# Patient Record
Sex: Male | Born: 1960 | Race: White | Hispanic: No | Marital: Single | Smoking: Current every day smoker
Health system: Southern US, Community
[De-identification: ages and names within clinical notes are randomized; demographics above are authoritative.]

## PROBLEM LIST (undated history)

## (undated) DIAGNOSIS — F1010 Alcohol abuse, uncomplicated: Secondary | ICD-10-CM

## (undated) DIAGNOSIS — F419 Anxiety disorder, unspecified: Secondary | ICD-10-CM

## (undated) DIAGNOSIS — F329 Major depressive disorder, single episode, unspecified: Secondary | ICD-10-CM

## (undated) DIAGNOSIS — F32A Depression, unspecified: Secondary | ICD-10-CM

## (undated) DIAGNOSIS — F1920 Other psychoactive substance dependence, uncomplicated: Secondary | ICD-10-CM

## (undated) HISTORY — PX: NOSE SURGERY: SHX723

## (undated) HISTORY — PX: NO PAST SURGERIES: SHX2092

---

## 2001-11-15 ENCOUNTER — Encounter: Payer: Self-pay | Admitting: Emergency Medicine

## 2001-11-15 ENCOUNTER — Emergency Department (HOSPITAL_COMMUNITY): Admission: EM | Admit: 2001-11-15 | Discharge: 2001-11-15 | Payer: Self-pay | Admitting: Emergency Medicine

## 2011-01-07 ENCOUNTER — Encounter (HOSPITAL_COMMUNITY): Payer: Self-pay | Admitting: *Deleted

## 2011-01-07 ENCOUNTER — Encounter: Payer: Self-pay | Admitting: *Deleted

## 2011-01-07 ENCOUNTER — Inpatient Hospital Stay (HOSPITAL_COMMUNITY)
Admission: AD | Admit: 2011-01-07 | Discharge: 2011-01-11 | DRG: 751 | Disposition: A | Discharge status: Home / Self Care | Payer: BC Managed Care – PPO | Source: Ambulatory Visit | Attending: Psychiatry | Admitting: Psychiatry

## 2011-01-07 ENCOUNTER — Emergency Department (HOSPITAL_COMMUNITY)
Admission: EM | Admit: 2011-01-07 | Discharge: 2011-01-07 | Disposition: A | Discharge status: Other Acute Inpatient Hospital | Payer: BC Managed Care – PPO | Attending: Emergency Medicine | Admitting: Emergency Medicine

## 2011-01-07 DIAGNOSIS — R45851 Suicidal ideations: Secondary | ICD-10-CM

## 2011-01-07 DIAGNOSIS — Z79899 Other long term (current) drug therapy: Secondary | ICD-10-CM

## 2011-01-07 DIAGNOSIS — F329 Major depressive disorder, single episode, unspecified: Secondary | ICD-10-CM

## 2011-01-07 DIAGNOSIS — R259 Unspecified abnormal involuntary movements: Secondary | ICD-10-CM | POA: Insufficient documentation

## 2011-01-07 DIAGNOSIS — F3289 Other specified depressive episodes: Secondary | ICD-10-CM

## 2011-01-07 DIAGNOSIS — F17200 Nicotine dependence, unspecified, uncomplicated: Secondary | ICD-10-CM | POA: Insufficient documentation

## 2011-01-07 DIAGNOSIS — F1410 Cocaine abuse, uncomplicated: Secondary | ICD-10-CM | POA: Insufficient documentation

## 2011-01-07 DIAGNOSIS — F411 Generalized anxiety disorder: Secondary | ICD-10-CM

## 2011-01-07 DIAGNOSIS — F1020 Alcohol dependence, uncomplicated: Secondary | ICD-10-CM | POA: Insufficient documentation

## 2011-01-07 DIAGNOSIS — F1010 Alcohol abuse, uncomplicated: Principal | ICD-10-CM

## 2011-01-07 DIAGNOSIS — F1994 Other psychoactive substance use, unspecified with psychoactive substance-induced mood disorder: Secondary | ICD-10-CM

## 2011-01-07 DIAGNOSIS — F1210 Cannabis abuse, uncomplicated: Secondary | ICD-10-CM | POA: Insufficient documentation

## 2011-01-07 HISTORY — DX: Depression, unspecified: F32.A

## 2011-01-07 HISTORY — DX: Alcohol abuse, uncomplicated: F10.10

## 2011-01-07 HISTORY — DX: Major depressive disorder, single episode, unspecified: F32.9

## 2011-01-07 HISTORY — DX: Anxiety disorder, unspecified: F41.9

## 2011-01-07 LAB — RAPID URINE DRUG SCREEN, HOSP PERFORMED
Cocaine: POSITIVE — AB
Opiates: NOT DETECTED

## 2011-01-07 LAB — CBC
HCT: 43.8 % (ref 39.0–52.0)
Hemoglobin: 15.9 g/dL (ref 13.0–17.0)
MCH: 31.9 pg (ref 26.0–34.0)
MCHC: 36.3 g/dL — ABNORMAL HIGH (ref 30.0–36.0)
MCV: 87.8 fL (ref 78.0–100.0)
Platelets: 151 10*3/uL (ref 150–400)
RBC: 4.99 MIL/uL (ref 4.22–5.81)
RDW: 13.2 % (ref 11.5–15.5)
WBC: 10.2 10*3/uL (ref 4.0–10.5)

## 2011-01-07 LAB — COMPREHENSIVE METABOLIC PANEL
ALT: 52 U/L (ref 0–53)
AST: 61 U/L — ABNORMAL HIGH (ref 0–37)
Albumin: 3.9 g/dL (ref 3.5–5.2)
Alkaline Phosphatase: 95 U/L (ref 39–117)
BUN: 7 mg/dL (ref 6–23)
CO2: 26 mEq/L (ref 19–32)
Calcium: 9.4 mg/dL (ref 8.4–10.5)
Chloride: 96 mEq/L (ref 96–112)
Creatinine, Ser: 0.77 mg/dL (ref 0.50–1.35)
GFR calc Af Amer: >90 mL/min (ref >90)
GFR calc non Af Amer: >90 mL/min (ref >90)
Glucose, Bld: 93 mg/dL (ref 70–99)
Potassium: 3.7 mEq/L (ref 3.5–5.1)
Sodium: 135 mEq/L (ref 135–145)
Total Bilirubin: 0.6 mg/dL (ref 0.3–1.2)
Total Protein: 8.2 g/dL (ref 6.0–8.3)

## 2011-01-07 LAB — ETHANOL: Alcohol, Ethyl (B): <11 mg/dL (ref 0–11)

## 2011-01-07 LAB — ACETAMINOPHEN LEVEL: Acetaminophen (Tylenol), Serum: <15 ug/mL (ref 10–30)

## 2011-01-07 MED ORDER — PNEUMOCOCCAL VAC POLYVALENT 25 MCG/0.5ML IJ INJ
0.5000 mL | INJECTION | INTRAMUSCULAR | Status: AC
  Administered 2011-01-08: 0.5 mL via INTRAMUSCULAR

## 2011-01-07 MED ORDER — CHLORDIAZEPOXIDE HCL 25 MG PO CAPS
25.0000 mg | ORAL_CAPSULE | ORAL | Status: AC
  Administered 2011-01-09 – 2011-01-10 (×2): 25 mg via ORAL
  Filled 2011-01-07 (×2): qty 1

## 2011-01-07 MED ORDER — CHLORDIAZEPOXIDE HCL 25 MG PO CAPS
50.0000 mg | ORAL_CAPSULE | Freq: Once | ORAL | Status: AC
  Administered 2011-01-07: 50 mg via ORAL

## 2011-01-07 MED ORDER — LOPERAMIDE HCL 2 MG PO CAPS: 2.0000 mg | ORAL_CAPSULE | ORAL | Status: AC | PRN

## 2011-01-07 MED ORDER — MAGNESIUM HYDROXIDE 400 MG/5ML PO SUSP: 30.0000 mL | Freq: Every day | ORAL | Status: DC | PRN

## 2011-01-07 MED ORDER — ONDANSETRON 4 MG PO TBDP: 4.0000 mg | ORAL_TABLET | Freq: Four times a day (QID) | ORAL | Status: AC | PRN

## 2011-01-07 MED ORDER — ACETAMINOPHEN 325 MG PO TABS: 650.0000 mg | ORAL_TABLET | Freq: Four times a day (QID) | ORAL | Status: DC | PRN

## 2011-01-07 MED ORDER — CHLORDIAZEPOXIDE HCL 25 MG PO CAPS
25.0000 mg | ORAL_CAPSULE | Freq: Four times a day (QID) | ORAL | Status: AC
  Administered 2011-01-07 – 2011-01-08 (×4): 25 mg via ORAL
  Filled 2011-01-07 (×4): qty 1

## 2011-01-07 MED ORDER — IBUPROFEN 200 MG PO TABS: 600.0000 mg | ORAL_TABLET | Freq: Three times a day (TID) | ORAL | Status: DC | PRN

## 2011-01-07 MED ORDER — HYDROXYZINE PAMOATE 25 MG PO CAPS: 25.0000 mg | ORAL_CAPSULE | Freq: Four times a day (QID) | ORAL | Status: DC | PRN

## 2011-01-07 MED ORDER — ONDANSETRON HCL 4 MG PO TABS: 4.0000 mg | ORAL_TABLET | Freq: Three times a day (TID) | ORAL | Status: DC | PRN

## 2011-01-07 MED ORDER — CHLORDIAZEPOXIDE HCL 25 MG PO CAPS
25.0000 mg | ORAL_CAPSULE | Freq: Four times a day (QID) | ORAL | Status: DC | PRN
Start: 2011-01-07 — End: 2011-01-07
  Filled 2011-01-07: qty 2

## 2011-01-07 MED ORDER — CHLORDIAZEPOXIDE HCL 25 MG PO CAPS
25.0000 mg | ORAL_CAPSULE | Freq: Every day | ORAL | Status: AC
  Administered 2011-01-11: 25 mg via ORAL
  Filled 2011-01-07: qty 1

## 2011-01-07 MED ORDER — CHLORDIAZEPOXIDE HCL 25 MG PO CAPS
25.0000 mg | ORAL_CAPSULE | Freq: Three times a day (TID) | ORAL | Status: AC
  Administered 2011-01-08 – 2011-01-09 (×3): 25 mg via ORAL
  Filled 2011-01-07 (×3): qty 1

## 2011-01-07 MED ORDER — CHLORDIAZEPOXIDE HCL 25 MG PO CAPS
25.0000 mg | ORAL_CAPSULE | Freq: Four times a day (QID) | ORAL | Status: AC | PRN
  Administered 2011-01-10: 25 mg via ORAL

## 2011-01-07 MED ORDER — VITAMIN B-1 100 MG PO TABS
100.0000 mg | ORAL_TABLET | Freq: Every day | ORAL | Status: DC
  Administered 2011-01-08 – 2011-01-11 (×4): 100 mg via ORAL
  Filled 2011-01-07 (×6): qty 1

## 2011-01-07 MED ORDER — THERA M PLUS PO TABS
1.0000 | ORAL_TABLET | Freq: Every day | ORAL | Status: DC
  Administered 2011-01-07: 19:00:00 via ORAL
  Administered 2011-01-08: 1 via ORAL
  Administered 2011-01-09: 09:00:00 via ORAL
  Administered 2011-01-10 – 2011-01-11 (×2): 1 via ORAL
  Filled 2011-01-07 (×6): qty 1

## 2011-01-07 MED ORDER — ALUM & MAG HYDROXIDE-SIMETH 200-200-20 MG/5ML PO SUSP: 30.0000 mL | ORAL | Status: DC | PRN

## 2011-01-07 MED ORDER — INFLUENZA VIRUS VACC SPLIT PF IM SUSP
0.5000 mL | INTRAMUSCULAR | Status: AC
  Administered 2011-01-08: 0.5 mL via INTRAMUSCULAR

## 2011-01-07 MED ORDER — ACETAMINOPHEN 325 MG PO TABS: 650.0000 mg | ORAL_TABLET | ORAL | Status: DC | PRN

## 2011-01-07 MED ORDER — HYDROXYZINE HCL 25 MG PO TABS
25.0000 mg | ORAL_TABLET | Freq: Four times a day (QID) | ORAL | Status: AC | PRN
  Administered 2011-01-07 – 2011-01-09 (×2): 25 mg via ORAL

## 2011-01-07 MED ORDER — ZOLPIDEM TARTRATE 5 MG PO TABS: 5.0000 mg | ORAL_TABLET | Freq: Every evening | ORAL | Status: DC | PRN

## 2011-01-07 MED ORDER — NICOTINE 21 MG/24HR TD PT24: 21.0000 mg | MEDICATED_PATCH | Freq: Every day | TRANSDERMAL | Status: DC | PRN

## 2011-01-07 NOTE — ED Notes (Addendum)
Attempt to call report to [facility], RN not able to take report at this time

## 2011-01-07 NOTE — ED Notes (Signed)
Pt states "here because of ETOH, drank last drink yesterday 2-3 pm, drinking 20-30 beers every day"

## 2011-01-07 NOTE — ED Notes (Signed)
Psych Assessment: pt arrived to room 35, ambulatory, alert and oriented. Reports chief complain is ETOH withdrawal. Pt is voluntary wanting to seek help for the withdrawl. Reports he drinks approximately 20-30 cans of beer per day. Pt last drink was last night, didn't drink today. Report nausea, minor hand tremor noted. Pt has good thought process, logical judgement, and good insight. Respiration even and unlabored. Good circulation, and able to move all extremities

## 2011-01-07 NOTE — Consult Note (Signed)
Patient Identification:  Gerald Rivera Date of Evaluation:  01/07/2011   History of Present Illness: Patient seen and assessment reviewed. 50 year old Caucasian male with history of polysubstance dependence came for alcohol detox and is motivated for inpatient rehabilitation also. Patient is logical and goal-directed not suicidal or homicidal not hallucinating or delusional. Alert awake oriented x3. Patient is still tremulous. Memory immediate recent remote fair insight and judgment intact.   Past Medical History:     Past Medical History  Diagnosis Date  . Alcohol abuse       History reviewed. No pertinent past surgical history.  Allergies: No Known Allergies  Current Medications:  Prior to Admission medications   Medication Sig Start Date End Date Taking? Authorizing Provider  acetaminophen (TYLENOL) 500 MG tablet Take 500 mg by mouth every 6 (six) hours as needed. pain    Yes Historical Provider, MD    Social History:    reports that he has been smoking.  He does not have any smokeless tobacco history on file. He reports that he drinks about 144 ounces of alcohol per week. He reports that he uses illicit drugs (Marijuana).   Family History:    No family history on file.   DIAGNOSIS:   AXIS I  polysubstance dependence   AXIS II  Deffered  AXIS III See medical notes.  AXIS IV  recent death of the mother   AXIS V 40     Recommendations: #1 patient will be started on a Librium detox protocol and will be admitted in the inpatient setting.   Eulogio Ditch, MD

## 2011-01-07 NOTE — ED Notes (Signed)
Pt been accepted to [facility], report given to [facility]. Pt will be transfer to [facility].

## 2011-01-07 NOTE — ED Notes (Signed)
One patient belonging bag locked in activity room. 

## 2011-01-07 NOTE — ED Notes (Signed)
Report given to [facility]  

## 2011-01-07 NOTE — ED Provider Notes (Signed)
History     CSN: [id number] Arrival date & time: 01/07/2011  8:00 AM   First MD Initiated Contact with Patient 01/07/11 [phone]      Chief Complaint  Patient presents with  . Medical Clearance    (Consider location/radiation/quality/duration/timing/severity/associated sxs/prior treatment) Patient is a 50 y.o. male presenting with alcohol problem.  Alcohol Problem This is a chronic problem. The current episode started more than 1 week ago. The problem occurs constantly (He drinks large amounts of beer every day.). Progression since onset: His drinking got worse 3 months ago, when his mother died. Pertinent negatives include no chest pain, no abdominal pain, no headaches and no shortness of breath. The symptoms are aggravated by nothing. The symptoms are relieved by nothing. He has tried nothing (He has never gone through a detox program) for the symptoms.   the patient feels that the alcohol use is preventing him from his job, and taking over his life. He has noticed a tremor when he stops drinking.    Past Medical History  Diagnosis Date  . Alcohol abuse     History reviewed. No pertinent past surgical history.  No family history on file.  History  Substance Use Topics  . Smoking status: Current Everyday Smoker -- 2.0 packs/day  . Smokeless tobacco: Not on file  . Alcohol Use: 144.0 oz/week    240 Cans of beer per week      Review of Systems  Respiratory: Negative for shortness of breath.   Cardiovascular: Negative for chest pain.  Gastrointestinal: Negative for abdominal pain.  Neurological: Negative for headaches.  All other systems reviewed and are negative.    Allergies  Review of patient's allergies indicates no known allergies.  Home Medications   Current Outpatient Rx  Name Route Sig Dispense Refill  . ACETAMINOPHEN 500 MG PO TABS Oral Take 500 mg by mouth every 6 (six) hours as needed. pain       BP 140/102  Pulse 95  Temp(Src) 97.8 F (36.6 C) (Oral)   Resp 16  Wt 180 lb (81.647 kg)  SpO2 100%  Physical Exam  Constitutional: He is oriented to person, place, and time. He appears well-developed and well-nourished.  HENT:  Head: Normocephalic and atraumatic.  Eyes: Conjunctivae and EOM are normal. Pupils are equal, round, and reactive to light.  Neck: Normal range of motion. Neck supple.  Cardiovascular: Normal rate.   Pulmonary/Chest: Effort normal and breath sounds normal.  Abdominal: Soft. Bowel sounds are normal.  Musculoskeletal: Normal range of motion.  Neurological: He is alert and oriented to person, place, and time. No cranial nerve deficit. He exhibits normal muscle tone. Coordination normal.       Mild non-intentional tremor  Skin: Skin is warm and dry.  Psychiatric: He has a normal mood and affect. His behavior is normal. Judgment and thought content normal.    ED Course  Procedures (including critical care time) Holding orders and move to psychiatric unit are initiated. Labs Reviewed  URINE RAPID DRUG SCREEN (HOSP PERFORMED) - Abnormal; Notable for the following:    Cocaine POSITIVE (*)    Tetrahydrocannabinol POSITIVE (*)    All other components within normal limits  CBC  COMPREHENSIVE METABOLIC PANEL  ETHANOL  ACETAMINOPHEN LEVEL   No results found.   1. Alcoholism   2. Cocaine abuse   3. Marijuana abuse       MDM  He has alcoholism, with desire to stop. He is showing early signs of alcohol withdrawal syndrome,  and will need to be monitored closely. Associated polysubstance abuse.        Flint Melter, MD 01/07/11 2046

## 2011-01-07 NOTE — BH Assessment (Addendum)
Assessment Note   Gerald Rivera is an 50 y.o. male.  Patient presented to the [facility] ED requesting detox from ETOH, Crack/Cocaine, and THC. Patient reports he has been binge drinking for 6 days in a row in an attempt to self medicate to deal with mother's death 4 months ago. 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  Patient reports drinking 25-30 12 oz cans of beer daily for the past 6 days and he last drank yesterday. Patient denies drinking beer today.   Patient also reports cocaine use 4 days ago, and THC "a few days" ago. Unable to recall amount used.  Longest period of sobriety for patient is unknown as he has always drank alcohol and he reports it has recently become a problem where he is unable to stop.  Patient reports being motivated because he has responsibilities he is unable to deal with while intoxicated.  Patient denies SI/HI/AVH.  Axis I: 304.80  Polysubstance Dependence,  Axis II: Deferred Axis III:  Past Medical History  Diagnosis Date  . Alcohol abuse    Axis IV: problems with primary support group Axis V: 41-50 serious symptoms  Past Medical History:  Past Medical History  Diagnosis Date  . Alcohol abuse     History reviewed. No pertinent past surgical history.  Family History: No family history on file.  Social History:  reports that he has been smoking.  He does not have any smokeless tobacco history on file. He reports that he drinks about 144 ounces of alcohol per week. He reports that he uses illicit drugs (Marijuana).  Allergies: No Known Allergies  Home Medications:  Medications Prior to Admission  Medication Dose Route Frequency Provider Last Rate Last Dose  . acetaminophen (TYLENOL) tablet 650 mg  650 mg Oral Q4H PRN Flint Melter, MD      . chlordiazePOXIDE (LIBRIUM) capsule 25 mg  25 mg Oral QID PRN Flint Melter, MD      . chlordiazePOXIDE (LIBRIUM) capsule 50 mg  50 mg Oral  Once Flint Melter, MD   50 mg at 01/07/11 1003  . ibuprofen (ADVIL,MOTRIN) tablet 600 mg  600 mg Oral Q8H PRN Flint Melter, MD      . nicotine (NICODERM CQ - dosed in mg/24 hours) patch 21 mg  21 mg Transdermal Daily PRN Flint Melter, MD      . ondansetron [facility]) tablet 4 mg  4 mg Oral Q8H PRN Flint Melter, MD      . zolpidem (AMBIEN) tablet 5 mg  5 mg Oral QHS PRN Flint Melter, MD       No current outpatient prescriptions on file as of 01/07/2011.    OB/GYN Status:  No LMP for male patient.  General Assessment Data Assessment Number: 1  Living Arrangements: Alone Can pt return to current living arrangement?: Yes Admission Status: Voluntary Is patient capable of signing voluntary admission?: Yes Transfer from: Acute Hospital Referral Source: Self/Family/Friend  Risk to self Suicidal Ideation: No Suicidal Intent: No Is patient at risk for suicide?: No Suicidal Plan?: No Access to Means: No What has been your use of drugs/alcohol within the last 12 months?: ETOH, THC, Cocaine/Daily/25-30 beers, varies, varies/Daily binge/Ongoing SA Other Self Harm Risks: na Triggers for Past Attempts: None known Intentional Self Injurious Behavior: None Factors that decrease suicide risk: Absense of psychosis Family Suicide History: No Recent stressful life event(s): Loss (  Comment) (Mother died 4 months ago) Persecutory voices/beliefs?: No Depression: Yes Depression Symptoms: Tearfulness;Isolating;Guilt;Loss of interest in usual pleasures;Feeling worthless/self pity Substance abuse history and/or treatment for substance abuse?: Yes (Unable to recall location, "years ago") Suicide prevention information given to non-admitted patients: Not applicable  Risk to Others Homicidal Ideation: No Thoughts of Harm to Others: No Current Homicidal Intent: No Current Homicidal Plan: No Access to Homicidal Means: No History of harm to others?: No Assessment of Violence: None  Noted Violent Behavior Description:   Does patient have access to weapons?: No Criminal Charges Pending?: No Does patient have a court date: No  Mental Status Report Appear/Hygiene: Disheveled Eye Contact: Fair Motor Activity: Restlessness Speech: Soft;Slow Level of Consciousness: Alert Mood: Guilty;Depressed Affect: Depressed Anxiety Level: None Thought Processes: Coherent;Relevant Judgement: Unimpaired Orientation: Place;Person;Time;Situation Obsessive Compulsive Thoughts/Behaviors: None  Cognitive Functioning Concentration: Normal Memory: Recent Intact;Remote Intact IQ: Average Insight: Fair Impulse Control: Poor Appetite: Good Sleep: Decreased Total Hours of Sleep:  (Not consistent) Vegetative Symptoms: None  Prior Inpatient/Outpatient Therapy Prior Therapy: Outpatient Prior Therapy Dates:  (Unable to recall) Prior Therapy Facilty/Provider(s):  (Unable to recall) Reason for Treatment:  (Rehab)            Values / Beliefs Cultural Requests During Hospitalization: None Spiritual Requests During Hospitalization: None        Additional Information 1:1 In Past 12 Months?: No CIRT Risk: No Elopement Risk: No Does patient have medical clearance?: Yes     Disposition:  Disposition Disposition of Patient: Inpatient treatment program (Pending [facility] Inpt detox)  On Site Evaluation by:   Reviewed with Physician:     Ileene Hutchinson 01/07/2011 11:30 AM   Patient has been accepted by Dr. Rogers Blocker to Dr. Dan Humphreys, bed 500-2. Patient's support paperwork has been completed. EDP notified and is in agreement with disposition. EDP will discharge pt to [facility]. Pt nurse notified as well. ALL appropriate paperwork completed and forwarded to [facility] for review.   Ileene Hutchinson , MSW, LCSWA 01/07/2011 1:03 PM

## 2011-01-07 NOTE — Tx Team (Signed)
Initial Interdisciplinary Treatment Plan  PATIENT STRENGTHS: (choose at least two) Ability for insight Average or above average intelligence Communication skills General fund of knowledge Motivation for treatment/growth Physical Health Work skills  PATIENT STRESSORS: Substance abuse   PROBLEM LIST: Problem List/Patient Goals Date to be addressed Date deferred Reason deferred Estimated date of resolution  Substance abuse 01/07/11     depression 01/07/11                                                DISCHARGE CRITERIA:  Ability to meet basic life and health needs Improved stabilization in mood, thinking, and/or behavior Motivation to continue treatment in a less acute level of care Need for constant or close observation no longer present Withdrawal symptoms are absent or subacute and managed without 24-hour nursing intervention  PRELIMINARY DISCHARGE PLAN: Attend 12-step recovery group Return to previous living arrangement  PATIENT/FAMIILY INVOLVEMENT: This treatment plan has been presented to and reviewed with the patient, Gerald Rivera, and/or family member,  The patient and family have been given the opportunity to ask questions and make suggestions.  Beatrix Shipper 01/07/2011, 5:50 PM

## 2011-01-07 NOTE — Progress Notes (Addendum)
Pt admitted voluntary for substance abuse of alcohol. Pt drinking up to a case of beer a day. He lives alone and lists cousin and uncle as support. He denies si, hi and hallucinations. No medical hx. Pt reports that he has jury duty Dec 13. Pt uses marijuana and lists stressor as recent loss of mother.

## 2011-01-07 NOTE — ED Notes (Signed)
Pt & possessions wanded by [employer], report called Mosetta Putt, Charity fundraiser

## 2011-01-08 DIAGNOSIS — F1910 Other psychoactive substance abuse, uncomplicated: Secondary | ICD-10-CM

## 2011-01-08 MED ORDER — GUAIFENESIN ER 600 MG PO TB12
600.0000 mg | ORAL_TABLET | Freq: Two times a day (BID) | ORAL | Status: DC
  Administered 2011-01-08 – 2011-01-11 (×6): 600 mg via ORAL
  Filled 2011-01-08 (×9): qty 1

## 2011-01-08 MED ORDER — NICOTINE 21 MG/24HR TD PT24
21.0000 mg | MEDICATED_PATCH | Freq: Every day | TRANSDERMAL | Status: DC
  Administered 2011-01-08 – 2011-01-11 (×5): 21 mg via TRANSDERMAL
  Filled 2011-01-08 (×6): qty 1

## 2011-01-08 NOTE — Progress Notes (Signed)
Received pt from [facility], RN at 2300. Since that time, has been resting quietly in bed with eyes closed. Respirations are even and unlabored. No acute pain or discomfort noted. Safety has been maintained with Q15minute observation. Will continue current POC. 

## 2011-01-08 NOTE — Progress Notes (Signed)
Had minimal interaction with patient.  He does report that his day has been fine.  He is complaining of cold symptoms, mainly a nagging cough.  Reminded pt he had meds available for his symptoms if needed.  Encouraged to drink fluids.  Denies SI at this time.   He has no other complaints.  Safety maintained with q15 minute checks.

## 2011-01-08 NOTE — Progress Notes (Signed)
[facility] Group Notes: (Counselor/Nursing/MHT/Case Management/Adjunct)   Type of Therapy:  Group Therapy  Participation Level:  None  Participation Quality:  Attentive  Affect:  Blunted  Cognitive:  Appropriate  Insight:  None  Engagement in Group: Minimal   Engagement in Therapy:  None  Modes of Intervention:  Support and Exploration  Summary of Progress/Problems:  Gerald Rivera appeared attentive but did not verbally engage in group.   Gerald Rivera 01/08/2011  2:25 PM

## 2011-01-08 NOTE — Progress Notes (Signed)
[facility] Group Notes: (Counselor/Nursing/MHT/Case Management/Adjunct)  Type of Therapy: Group Therapy   Participation Level: Minimal  Participation Quality: Attentive   Affect: Blunted   Cognitive: Appropriate   Insight: None   Engagement in Group: Minimal   Engagement in Therapy: None   Modes of Intervention: Support and Exploration   Summary of Progress/Problems: Gerald Rivera appeared to participate in meditation exercise but did not verbally engage in group.   [name]  01/08/2011 3:07 PM

## 2011-01-08 NOTE — Progress Notes (Addendum)
Suicide Risk Assessment  Admission Assessment     Demographic factors:  Assessment Details Time of Assessment: Admission Information Obtained From: Patient Current Mental Status:  Current Mental Status:  (denies si) Loss Factors:  Loss Factors:  (none) Historical Factors:  Historical Factors:  (none) Risk Reduction Factors:  Risk Reduction Factors: Employed;Positive social support  CLINICAL FACTORS:   Depression:   Comorbid alcohol abuse/dependence Alcohol/Substance Abuse/Dependencies Previous Psychiatric Diagnoses and Treatments  COGNITIVE FEATURES THAT CONTRIBUTE TO RISK:  Closed-mindedness    SUICIDE RISK:   Mild:  Suicidal ideation of limited frequency, intensity, duration, and specificity.  There are no identifiable plans, no associated intent, mild dysphoria and related symptoms, good self-control (both objective and subjective assessment), few other risk factors, and identifiable protective factors, including available and accessible social support.  Patient denies suicidal or homicidal ideation, hallucinations, illusions, or delusions. Patient engages with good eye contact, is able to focus adequately in a one to one setting, and has clear goal directed thoughts. Patient speaks with a natural conversational volume, rate, and tone. Anxiety was reported at 1 on a scale of 1 the least and 10 the most. Depression was reported also at 1 on the same scale. Patient is oriented times 4, recent and remote memory intact. Judgement: limited by his alcoholic mind set and denial Insight: quite limited Plan: Detox and encourage him to consider attending AA after he leaves. ELOS: 3 to 5 days.  Gerald Rivera 01/08/2011, 5:02 PM

## 2011-01-08 NOTE — H&P (Signed)
Psychiatric Admission Assessment Adult  Patient Identification:  Gerald Rivera Date of Evaluation:  01/08/2011 Chief Complaint:  Polysubstance Dependence History of Present Illness:: Gerald Rivera is a 50 year old Caucasian male. Admitted from the [facility] ED presenting with long history of alcoholism. Patient reports, this time around, I drank for 7 days straight without eating any foods. I lost my mother 3 months ago, my drinking problems got worse since after Mom's death. 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 My drinking problem is just about ruin my life as I know it. I have had DUIs in the past, revoked licence as well. I have my license to drive now, I don't want to lose it again. I have gone from drinking to using other drugs, cocaine and weed. I have to deal with this problem or the out-come will not be good" Mood Symptoms:  Anhedonia Depression Helplessness Hopelessness Sadness Depression Symptoms:  depressed mood, feelings of worthlessness/guilt and hopelessness (Hypo) Manic Symptoms:   Elevated Mood:  No Irritable Mood:  Yes Grandiosity:  No Distractibility:  No Labiality of Mood:  No Delusions:  No Hallucinations:  No Impulsivity:  No Sexually Inappropriate Behavior:  No Financial Extravagance:  No Flight of Ideas:  No  Anxiety Symptoms: Excessive Worry:  Yes Panic Symptoms:  No Agoraphobia:  No Obsessive Compulsive: No  Symptoms: Patient denies Specific Phobias:  No Social Anxiety:  No  Psychotic Symptoms:  Hallucinations:  None Delusions:  No Paranoia:  No   Ideas of Reference:  No  PTSD Symptoms: Ever had a traumatic exposure:  No Had a traumatic exposure in the last month:  No Hypervigilance:  No Hyperarousal:  None Avoidance:  None  Traumatic Brain Injury: Patient denies.  Reviewed H&P from the ED, agreed with findings.  Past Psychiatric History: Diagnosis:poly-substance abuse and dependency  Hospitalizations:[facility]  Outpatient Care:None    Substance Abuse Care:None reported  Self-Mutilation:Denies  Suicidal Attempts:Denies  Violent Behaviors:Denies   Past Medical History:   Past Medical History  Diagnosis Date  . Alcohol abuse   . Anxiety   . Depression    History of Loss of Consciousness:  No Seizure History:  No Cardiac History:  No Allergies:  No Known Allergies Current Medications:  Current Facility-Administered Medications  Medication Dose Route Frequency Provider Last Rate Last Dose  . acetaminophen (TYLENOL) tablet 650 mg  650 mg Oral Q6H PRN Shamsher Alvie Heidelberg, MD      . alum & mag hydroxide-simeth (MAALOX/MYLANTA) 200-200-20 MG/5ML suspension 30 mL  30 mL Oral Q4H PRN Shamsher Alvie Heidelberg, MD      . chlordiazePOXIDE (LIBRIUM) capsule 25 mg  25 mg Oral Q6H PRN Katheren Puller, MD      . chlordiazePOXIDE (LIBRIUM) capsule 25 mg  25 mg Oral QID Katheren Puller, MD   25 mg at 01/08/11 1139   Followed by  . chlordiazePOXIDE (LIBRIUM) capsule 25 mg  25 mg Oral TID Katheren Puller, MD       Followed by  . chlordiazePOXIDE (LIBRIUM) capsule 25 mg  25 mg Oral BH-qamhs Shamsher S Rogers Blocker, MD       Followed by  . chlordiazePOXIDE (LIBRIUM) capsule 25 mg  25 mg Oral Daily Katheren Puller, MD      . hydrOXYzine (ATARAX/VISTARIL) tablet 25 mg  25 mg Oral Q6H PRN Lorenza Evangelist, PHARMD   25 mg at 01/07/11 2139  . influenza  inactive virus vaccine (FLUZONE/FLUARIX) injection 0.5 mL  0.5  mL Intramuscular Tomorrow-1000 Edwin Walker   0.5 mL at 01/08/11 1027  . loperamide (IMODIUM) capsule 2-4 mg  2-4 mg Oral PRN Katheren Puller, MD      . magnesium hydroxide (MILK OF MAGNESIA) suspension 30 mL  30 mL Oral Daily PRN Katheren Puller, MD      . multivitamins ther. w/minerals tablet 1 tablet  1 tablet Oral Daily Katheren Puller, MD   1 tablet at 01/08/11 0758  . nicotine (NICODERM CQ - dosed in mg/24 hours) patch 21 mg  21 mg Transdermal Daily Edwin Walker   21 mg at 01/08/11 0800  .  ondansetron (ZOFRAN-ODT) disintegrating tablet 4 mg  4 mg Oral Q6H PRN Katheren Puller, MD      . pneumococcal 23 valent vaccine (PNU-IMMUNE) injection 0.5 mL  0.5 mL Intramuscular Tomorrow-1000 Edwin Walker   0.5 mL at 01/08/11 1025  . thiamine (VITAMIN B-1) tablet 100 mg  100 mg Oral Daily Katheren Puller, MD   100 mg at 01/08/11 0758  . DISCONTD: hydrOXYzine (VISTARIL) capsule 25 mg  25 mg Oral Q6H PRN Katheren Puller, MD       Facility-Administered Medications Ordered in Other Encounters  Medication Dose Route Frequency Provider Last Rate Last Dose  . DISCONTD: acetaminophen (TYLENOL) tablet 650 mg  650 mg Oral Q4H PRN Flint Melter, MD      . DISCONTD: chlordiazePOXIDE (LIBRIUM) capsule 25 mg  25 mg Oral QID PRN Flint Melter, MD      . DISCONTD: ibuprofen (ADVIL,MOTRIN) tablet 600 mg  600 mg Oral Q8H PRN Flint Melter, MD      . DISCONTD: nicotine (NICODERM CQ - dosed in mg/24 hours) patch 21 mg  21 mg Transdermal Daily PRN Flint Melter, MD      . DISCONTD: ondansetron (ZOFRAN) tablet 4 mg  4 mg Oral Q8H PRN Flint Melter, MD      . DISCONTD: zolpidem (AMBIEN) tablet 5 mg  5 mg Oral QHS PRN Flint Melter, MD        Previous Psychotropic Medications:  Medication Dose  "I have never been on any medications"                      Substance Abuse History in the last 12 months: Substance Age of 1st Use Last Use Amount Specific Type  Nicotine 15 Prior to hospitalization. 1 pack daily   Alcohol 17 "7 days ago" "A lot of ETOH"   Cannabis 17  "7 days ago"    Opiates Denies     Cocaine 17 "7 days ago"    Methamphetamines Denies use     LSD Denies use     Ecstasy Denies use     Benzodiazepines Denies use     Caffeine      Inhalants      Others:                         Medical Consequences of Substance Abuse:Liver damage, Addiction risks.  Legal Consequences of Substance Abuse: DUIs, Arrests/jail time, revoke/loss of driving license.  Family  Consequences of Substance Abuse: Family separation/divorce  Blackouts:  No DT's:  No Withdrawal Symptoms:  Tremors  Social History:   Family Members:Father, daughter Marital Status:  Divorced Children:  Daughters: Armed forces technical officer; A stocker at [employer] History:Hx: DUIs  Family History:  COPD: mother, Alcoholism: parents.  Mental Status Examination/Evaluation:  Objective:  Appearance: Well Groomed  Patent attorney::  Good  Speech:  Clear and Coherent  Volume:  Normal  Mood:  "I'm in a good mood now, ready to get help"  Affect:  Flat  Thought Process:  Logical  Orientation:  Full  Thought Content:  Denies AVH  Suicidal Thoughts:  No  Homicidal Thoughts:  No  Judgement:  Intact  Insight:  Good  Psychomotor Activity:  Normal  Akathisia:  No  Handed:  Right  AIMS (if indicated):     Assets:  Desire for Improvement           Assessment:    AXIS I Substance Induced Mood Disorder  AXIS II Deferred  AXIS III Past Medical History  Diagnosis Date  . Alcohol abuse   . Anxiety   . Depression      AXIS IV Problems associated with substance abuse and dependency  AXIS V 51-60 moderate symptoms   Treatment Plan/Recommendations: Pt. Will be admitted to [facility] for detox. Librium protocol initiated.  Treatment Plan Summary: Daily contact with patient to assess and evaluate symptoms and progress in treatment Medication management  Observation Level/Precautions:  Q 15 minutes checks for safety.  Laboratory:  Reviewed laboratory findings from ED, agreed with findings.  Psychotherapy:  Group    Routine PRN Medications:  Yes     Discharge Concerns: Continuing with out-patient substance abuse treatment to maintain sobriety.  Other:  Being able to keep his job.    Armandina Stammer I 12/5/20122:27 PM

## 2011-01-08 NOTE — Progress Notes (Signed)
Patient ID: Gerald Rivera, male   DOB: May 24, 1960, 50 y.o.   MRN: [id number] Pt is awake and active on the unit this AM. Pt is participating in the milieu and will begin processing on the 300 hall today. Pt states that he wishes to stay clean and sober after discharge but is not very interested in AA as a resource. Writer encouraged pt to consider the benefits of having a support group when he is on his own. Pt believes that he will not need to attend ant 12 step groups. Pt denies SI/HI. Writer will continue to monitor.

## 2011-01-08 NOTE — Progress Notes (Signed)
Patient ID: Gerald Rivera, male   DOB: Jan 26, 1961, 50 y.o.   MRN: [id number] S: "I have been coughing since getting here, I cough at home sometimes. It is getting worse here"  O: Frequent coughing episodes. No sputum production noted. Patient is afebrile.      Lung fields clear bilaterally. Denies chest pains and or SOB. Patient is a smoker.  A: Cough.  P. Initiate Mucinex 600 mg bid.     Encouraged fluids intake.     Encouraged on the need to quit smoking.

## 2011-01-08 NOTE — Progress Notes (Signed)
Pt attended discharge planning group and actively participated.  Pt presents with flat affect and depressed mood.  Pt has disheveled appearance.  Pt was open with sharing reason for entering the hospital.  Pt states that he was depressed about the loss of his mother 3 months ago.  Pt states he has always drank alcohol but began drinking heavily when his mother died.  Pt states he was living with his mom when she passed.  Pt continues to live at this home and has transportation.  Pt states he has family support and is employed.  Pt states his employer is aware he is hospitalized and is supportive.  Pt states he has never been to treatment, AA meetings or seen a psychiatrist and therapist.  Pt open to trying AA meetings but doesn't want long term treatment.  SW provided pt with AA meetings in [place].  SW will explore referral options.  Safety planning and suicide prevention discussed.     Reyes Ivan, LCSWA 01/08/2011  10:40 AM

## 2011-01-08 NOTE — Tx Team (Signed)
Interdisciplinary Treatment Plan Update (Adult)  Date:  01/08/2011  Time Reviewed:  10:43 AM   Progress in Treatment: Attending groups: Yes Participating in groups:  Yes Taking medication as prescribed: Yes Tolerating medication:  Yes Family/Significant othe contact made:  Counselor assessing for appropriate contact Patient understands diagnosis:  Yes Discussing patient identified problems/goals with staff:  Yes Medical problems stabilized or resolved:  Yes Denies suicidal/homicidal ideation: Yes Issues/concerns per patient self-inventory:  None identified Other: N/A  New problem(s) identified: None Identified  Reason for Continuation of Hospitalization: Anxiety Depression Medication stabilization Withdrawal symptoms  Interventions implemented related to continuation of hospitalization: mood stabilization, medication monitoring and adjustment, group therapy and psycho education, safety checks q 15 mins  Additional comments: N/A  Estimated length of stay: 3-5 days  Discharge Plan: SW will assess for appropriate referrals  New goal(s): N/A  Review of initial/current patient goals per problem list:   1.  Goal(s): Depression  Met:  No  Target date: by discharge  As evidenced by: Reduce depression from a 10 to a 3.   2.  Goal (s): Suicidal Ideation  Met:  No  Target date: by discharge  As evidenced by: Eliminate suicidal ideation.   3.  Goal(s): Anxiety  Met:  No  Target date: by discharge  As evidenced by: Reduce anxiety from a 10 to a 3.   4.  Goal(s):  Met:  No  Target date: by discharge  As evidenced by:   Attendees: Patient:  Gerald Rivera 01/08/2011  12:10 pm  Family:     Physician:  Orson Aloe, MD  01/08/2011  10:43 AM   Nursing:   Quintella Reichert, RN 01/08/2011 10:45 AM   Case Manager:  Reyes Ivan, LCSWA 01/08/2011  10:43 AM   Counselor:  Angus Palms, LCSW 01/08/2011  10:43 AM   Other:  Juline Patch, LCSW 01/08/2011  10:43 AM     Other:  Everlene Balls, RN 01/08/2011 10:45 AM   Other:  Serena Colonel, FNP 01/08/2011 10:45 AM   Other:      Scribe for Treatment Team:   Carmina Miller, 01/08/2011 , 10:43 AM

## 2011-01-09 NOTE — Progress Notes (Signed)
[facility] Adult Inpatient Family/Significant Other Suicide Prevention Education  Suicide Prevention Education:  Education Completed; Gerald Rivera, cousin,  has been identified by the patient as the family member/significant other with whom the patient will be residing, and identified as the person(s) who will aid the patient in the event of a mental health crisis (suicidal ideations/suicide attempt).  With written consent from the patient, the family member/significant other has been provided the following suicide prevention education, prior to the and/or following the discharge of the patient.  The suicide prevention education provided includes the following:  Suicide risk factors  Suicide prevention and interventions  National Suicide Hotline telephone number  [facility] assessment telephone number  [facility] Emergency Assistance 911  [facility] and/or Residential Mobile Crisis Unit telephone number  Request made of family/significant other to:  Remove weapons (e.g., guns, rifles, knives), all items previously/currently identified as safety concern.    Remove drugs/medications (over-the-counter, prescriptions, illicit drugs), all items previously/currently identified as a safety concern.  Gerald Rivera reports that, to her knowledge, Gerald Rivera does not have any access to weapons, and that she has never known him to be suicidal in the past. She states that she has not been a big part of his life for a while until his mother passed away. Gerald Rivera believes Gerald Rivera's father will be his main/best support, as he is a 20 year recovering alcoholic. She understands suicide prevention education and has no questions.   The family member/significant other verbalizes understanding of the suicide prevention education information provided.  The family member/significant other agrees to remove the items of safety concern listed above.  Gerald Rivera 01/09/2011, 1:18 PM

## 2011-01-09 NOTE — Progress Notes (Signed)
Adult Services Patient-Family Contact/Session  Attendees:  Counselor and Jojuan's cousin, Eunice Blase via telephone  Goal(s):  To provide suicide prevention education, to gather collateral information  Safety Concerns:  Eunice Blase has no safety concerns, but does not believe that Ledford can stay sober on his own  Narrative:  Eunice Blase reported that she has not been a big part of Gordie's life for the past several years until his mother died, because he and his mother secluded themselves in their home for 4 or 5 years and did not associate with anyone. She stated that his mother always took care of him, and that Valente had never managed his own finances or even written a check until his mother died 4 months ago. For these reasons, she does not believe that Ilan will succeed living on his own, and is trying to involve Curtiss's father. Debbie reported that Karry has been living in the house that his mother and uncle leased for his grandmother prior to her placement in a nursing home, and that the uncle cannot renew the lease on his own, so that Wasim will soon lose his home. She would like to see Thales live with his father, if father will agree to that, as father is a 20 year recovering alcoholic and would not allow Mical to drink in his home. Eunice Blase does not believe that Rathana has ever had any significant sober time, and does not think he will be able to achieve sobriety without further treatment, whether inpatient or outpatient.Debbie reports that Zayvon has no support system outside of herself, uncle, and possibly his father. She does not believe Kaisei has ever been suicidal and has no safety concerns, though she does recognize substance abuse as a contributing factor for suicidality. Debbie was at [facility] home last night and does not believe that he has access to firearms there or elsewhere.  Barrier(s):  Ronne reports a desire to get sober on his own and does not want to have further inpatient, outpatient  or 12 step involvement.  Interventions:  Counselor educated Debbie on suicide prevention. Counselor collected Debbie's concerns and collateral information. Counselor took [employer] father's name and number Ekholm Murchison, Montez Hageman. at [phone]) in case Angelos will allow him to be contacted.   Recommendation(s):  Joshoa to finish detox in [facility] and to follow up with outpatient SA treatment as set by case manager.  Follow-up Required:  No  Explanation:  Eunice Blase has no safety concerns.   [name] 01/09/2011, 1:27 PM

## 2011-01-09 NOTE — Progress Notes (Signed)
Patient ID: Gerald Rivera, male   DOB: December 11, 1960, 50 y.o.   MRN: [id number]  S: "I feel good today. I am ready to go home. I got a lot of work to do. I know my mother wants me to do well. I'm gonna make her proud of me. The Mucinex helped my cough. I'm not coughing much".  O: Patient is alert and oriented x 3. Affect is good, however, tears up when he mentioned his mother. Well groomed. Good eye contact. Speech is clear and coherent. Denies SIHI. Infrequent non-productive coughing episodes present. Denies SOB. Lung fields clear bilaterally. Patient is afebrile.  A: Major depressive disorder,     Substance abuse and dependency.  P. Will continue patient on current treatment plan.      Encouraged participation in group therapy and activities.

## 2011-01-09 NOTE — Progress Notes (Signed)
Patient ID: Gerald Rivera, male   DOB: 1960-11-01, 50 y.o.   MRN: [id number] Has been in bed most of the evening, stated he was tired and didn't wish to go to [place].  Stated it was a chance for him to catch up on rest.  Agreed to come down to med window,  But wasn't conversational, answers were brief and to the point, polite. Will continue to monitor.

## 2011-01-09 NOTE — Progress Notes (Signed)
Patient has been up and attending groups today on the 300 hall.  Did eventually move to that hallway this afternoon.  He rates depression and hopelessness at 6 and 9 respectively.  He states he feels he is ready to give up drinking.  I talked with him for a while about AA or other outpatient treatment.  He denies the need for any and states that he will be so busy that he will not have time to attend.  I did encourage him to at least think about it or think about attending at least a couple of meetings per week.  He agreed to at least think about it.  He has been pleasant and cooperative and interacting appropriately with peers.

## 2011-01-09 NOTE — Progress Notes (Signed)
Patient ID: Gerald Rivera, male   DOB: 05-10-1960, 50 y.o.   MRN: [id number] Pt seen in his room this afternoon.  He reported that he slept "pretty good".  He reported that he had no anxiety.  This is a vast improvement.  He had talked to his employer and they are okay with him taking time to help himself.  He is aware that alcohol can kill you, but was unsure how it actually does it.  I informed him that it effects the liver, the brain, the intestines, effects the conversion of testosterone to estrogen, effects the circulation with the intestines, causes muscle wasting.  He has experience with his father quitting drinking cold [place] on his own many years ago and not having any further problem with it.    He had a 15 minute conversation with his father and has developed a plan to help him stay away from others that drink and to stay away from drinking any more himself.  He plans to sell his house by June 24th.  He plans to continue to live in his current house and drive 24 miles to his current work place and stay busy by helpihg his father at the father's place.  He feels that staying busy will be his best defense to returning to drinking.    He has never attended any 12 step meetings before.  He was invited/enmcouraged to attend the one on the unit tonight.  However there may be [employer] and not a meeting.  There will be one tomorrow.   Pt in complete denial about how insidious alcohol can be in drawing people back into use despite their best intentions.

## 2011-01-09 NOTE — Progress Notes (Signed)
Pt attended discharge planning group and actively participated.  Pt presents with a calm mood an affect.  Pt report feeling ready to d/c soon.  Pt ranks depression and anxiety at a 0 today and denies SI.  Pt still not open to any further substance abuse treatment, including AA meetings.  Pt states he can stay sober on his own and does not need further treatment.  Pt is eager to get back to his job where he states he's worked for 11 years.  SW still provided pt with AA meeting brochure.  No further referrals needed for psychiatrist or therapist.    Reyes Ivan, LCSWA 01/09/2011  11:24 AM

## 2011-01-10 MED ORDER — TRAZODONE HCL 100 MG PO TABS: 100.0000 mg | ORAL_TABLET | Freq: Once | ORAL | Status: DC

## 2011-01-10 MED ORDER — ZOLPIDEM TARTRATE 10 MG PO TABS
10.0000 mg | ORAL_TABLET | Freq: Once | ORAL | Status: AC
  Administered 2011-01-10: 10 mg via ORAL
  Filled 2011-01-10: qty 1

## 2011-01-10 NOTE — Progress Notes (Signed)
[facility] Group Notes:  (Counselor/Nursing/MHT/Case Management/Adjunct)  01/10/2011 5:09 PM   Type of Therapy:  Processing Group at 11:00 am  Participation Level:  Did not attend  Clide Dales 01/10/2011 5:10 PM     [facility] Group Notes:  (Counselor/Nursing/MHT/Case Management/Adjunct)  01/10/2011 5:09 PM   Type of Therapy:  Counseling Group at 1:15 pm  Participation Level:  Did not attend    Ronda Fairly, LCSWA 01/10/2011 5:09 PM

## 2011-01-10 NOTE — Progress Notes (Signed)
Patient ID: Gerald Rivera, male   DOB: 07/27/1960, 50 y.o.   MRN: [id number] Pt denies SI/HI/AVH and depression/hopelessness.  He rates his sleep as well, appetite good, energy level normal, ability to pay attention is good.  No S&S of withdrawal.  Gerald Rivera plans to return to work at his previous job after discharge.

## 2011-01-10 NOTE — Progress Notes (Addendum)
Suicide Risk Assessment  Discharge Assessment     Demographic factors:  Assessment Details Time of Assessment: Admission Information Obtained From: Patient Current Mental Status:  Current Mental Status:  (denies si) Risk Reduction Factors:  Risk Reduction Factors: Employed;Positive social support  CLINICAL FACTORS:   Severe Anxiety and/or Agitation Alcohol/Substance Abuse/Dependencies Previous Psychiatric Diagnoses and Treatments  COGNITIVE FEATURES THAT CONTRIBUTE TO RISK:  Closed-mindedness    SUICIDE RISK:   Minimal: No identifiable suicidal ideation.  Patients presenting with no risk factors but with morbid ruminations; may be classified as minimal risk based on the severity of the depressive symptoms  Patient denies suicidal or homicidal ideation, hallucinations, illusions, or delusions. Patient engages with good eye contact, is able to focus adequately in a one to one setting, and has clear goal directed thoughts. Patient speaks with a natural conversational volume, rate, and tone. Anxiety was reported at 1 on a scale of 1 the least and 10 the most. Depression was reported also at 1 on the same scale. Patient is oriented times 4, recent and remote memory intact. Judgement: Patient was admitted for alcohol abuse and learned very little about how it actually insidiously destroys social networks and kills the body Insight: essentially nil, in that he will be too busy moving or going to work to seek any kind of support for his sobriety which he plans to maintain by his will power and staying busy like he sees his father doing Plan: D/C in AM, recommend 90 mtgs in 90 days.  [place] 01/10/2011, 3:58 PM

## 2011-01-10 NOTE — Discharge Summary (Signed)
Physician Discharge Summary  Patient ID: Gerald Rivera MRN: [id number] DOB/AGE: Feb 08, 1960 50 y.o.  Admit date: 01/07/2011 Discharge date: 01/10/2011  Reason for Admission: Alcohol abuse Hospital Course:  Patinet was admitted to [facility], where he admitted to alcohol use and need for detox, but steadfastly requested discharge because he has business to get to.  He eventually was transferred to the [facility] unit where he was exposed to substance abuse programming.  He called his biss who was supportive of him geeting th ehelp that he needed.  He focused only on getting back to his home or back to regualr contact with his father.  He never seemed to conceptualize that alcohol could dissentegrate your social support, your ability to maintain employment, or your body.  He focused on getting home and staying busy as the total and complete way to stay clean and sober.  Condition on Discharge: Patient denies suicidal or homicidal ideation, hallucinations, illusions, or delusions. Patient engages with good eye contact, is able to focus adequately in a one to one setting, and has clear goal directed thoughts. Patient speaks with a natural conversational volume, rate, and tone. Anxiety was reported at 1 on a scale of 1 the least and 10 the most. Depression was reported also at 1 on the same scale. Patient is oriented times 4, recent and remote memory intact. Judgement: Patient was admitted for alcohol abuse and learned very little about how it actually insidiously destroys social networks and kills the body Insight: essentially nil, in that he will be too busy moving or going to work to seek any kind of support for his sobriety which he plans to maintain by his will power and staying busy like he sees his father doing Plan: D/C in AM, recommend 90 mtgs in 90 days.  Discharge Diagnoses:  Active Problems:  * No active hospital problems. *      Current Discharge Medication List    CONTINUE  these medications which have NOT CHANGED   Details  acetaminophen (TYLENOL) 500 MG tablet Take 500 mg by mouth every 6 (six) hours as needed. pain        Follow-up Information    Follow up with AA meetings. (See brochure for meeting times and locations)          Signed: Orson Aloe 01/10/2011, 4:14 PM

## 2011-01-10 NOTE — Progress Notes (Signed)
Pt attended discharge planning group and actively participated.  Pt presents with calm mood and affect.  Pt denies depression, anxiety and SI.  Pt continues to refuse any referrals for psychiatrist, therapist, substance abuse treatment or AA meetings.  SW continues to encourage pt to attend AA meetings after d/c to stay sober.  Pt states he can stay sober on his own and doesn't need help with that.  SW still provided pt with AA meeting locations and times anyways.  Safety planning and suicide prevention discussed.     Gerald Rivera, LCSWA 01/10/2011  9:22 AM

## 2011-01-10 NOTE — Discharge Summary (Signed)
Discharge Note  Patient:  Gerald Rivera is an 50 y.o., male DOB:  1960-04-09   Level of Care:  OP  Discharge destination:  Home  Is patient on multiple antipsychotic therapies at discharge:  No    Patient phone:  [phone] (home)  Patient address:   [address] [place] [address]  Follow-up recommendations:    Comments:    The patient received suicide prevention pamphlet:  No Belongings returned:  Judene Companion, Akasha Melena 01/10/2011, 4:08 PM

## 2011-01-11 MED ORDER — TRAZODONE HCL 100 MG PO TABS: 100.0000 mg | ORAL_TABLET | Freq: Once | ORAL | Status: DC

## 2011-01-11 MED ORDER — TRAZODONE HCL 100 MG PO TABS: 100.0000 mg | ORAL_TABLET | Freq: Every day | ORAL | Status: AC

## 2011-01-11 NOTE — Progress Notes (Signed)
Suicide Risk Assessment  Discharge Assessment     Demographic factors:  Assessment Details Time of Assessment: Admission Information Obtained From: Patient Current Mental Status:  Current Mental Status:  (denies si) Risk Reduction Factors:  Risk Reduction Factors: Employed;Positive social support  CLINICAL FACTORS:   Dysthymia  COGNITIVE FEATURES THAT CONTRIBUTE TO RISK:  None  SUICIDE RISK:   Minimal: No identifiable suicidal ideation.  Patients presenting with no risk factors but with morbid ruminations; may be classified as minimal risk based on the severity of the depressive symptoms  PLAN OF CARE: Patient will be followup with the plan as we discussed in discharge planning  Gerald Rivera T. 01/11/2011, 10:14 AM

## 2011-01-11 NOTE — Progress Notes (Signed)
Patient ID: Gerald Rivera, male   DOB: [birth year], 50 y.o.   MRN: [id number] See d/c note

## 2011-01-11 NOTE — Discharge Summary (Signed)
  Patient ID: JAMARR TREINEN MRN: [id number] DOB/AGE: Dec 29, 1960 50 y.o.  Admit date: 01/07/2011 Discharge date: 01/11/2011  Admission Diagnosis: Substance abuse disorder.  Hospital Course: r. Legault is a 50 year old Caucasian male. Admitted from the [facility] ED presenting with long history of alcoholism. Patient reports, this time around, I drank for 7 days straight without eating any foods. I lost my mother 3 months ago, my drinking problems got worse since after Mom's death. 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 My drinking problem is just about ruin my life as I know as I know". While a patient here at [facility], patient received medication management as well as group therapy and activities. He currently denies SIHI, AVH. Has shown improvement in mood and behavior. He is discharged with 2 weeks sample of trazodone 100 mg to be taken at bedtime.  Hospital Course:   Discharge Diagnoses:  Active Problems:  * No active hospital problems. *    Discharged Condition: Improved    Current Discharge Medication List    START taking these medications   Details  traZODone (DESYREL) 100 MG tablet Take 1 tablet (100 mg total) by mouth once. Qty: 30 tablet, Refills: 0      CONTINUE these medications which have NOT CHANGED   Details  acetaminophen (TYLENOL) 500 MG tablet Take 500 mg by mouth every 6 (six) hours as needed. pain        Follow-up Information    Follow up with AA meetings. (See brochure for meeting times and locations)          Signed: Armandina Stammer I 01/11/2011, 9:36 AM

## 2011-01-11 NOTE — Progress Notes (Signed)
Pt. Did not attend aftercare planning group but spoke to therapist after group. Pt was cleared for D/C and denies any and all S/I and H/I.

## 2011-01-11 NOTE — Progress Notes (Signed)
Patient ID: Gerald Rivera, male   DOB: 1960/06/12, 50 y.o.   MRN: [id number] Nursing: Pt.denies lethality and A/V/H's or other withdrawal symptoms.  Pt. is aware he is to be discharged tomorrow and only asked for a med. for insomnia.  Order rec'd from on-call MD for Ambien which was given: Pt. then went to bed. 23:55--Pt. noted to be asleep.

## 2011-01-11 NOTE — Progress Notes (Signed)
    Date: 01/11/2011   RE: Gerald Rivera    To Whom It May Concern:  This letter is in regards to [employer].  He has requested that I contact you to inform you of His stay with the [facility]. He was admitted to the ED on 01/06/2011 and to the hospital on 01/07/2011  3:29 PM and has a discharge date of 01/11/2011. If you have any questions you may contact Rod North at [phone]. Thank you for your consideration.   Sincerely,         Dr Dan Humphreys, MD        ** If you require additional information, please contact your client to obtain a written authorization to release protected health information. Art gallery manager do not permit release of information without a signed authorization. The employee may stop by the Hospital to complete an Authorization to Release Protected Health Information.  Thank you for your cooperation in this regard.   ** Above dates completed by  Gevena Mart, MA, LPCS, LCAS, CCS, MAC, NCC , BC-DMT, RDT/BCT

## 2011-01-11 NOTE — Progress Notes (Signed)
[facility] Case Management Discharge Plan:  Will you be returning to the same living situation after discharge: Yes,  apt. Would you like a referral for services when you are discharged:No. Do you have access to transportation at discharge:Yes,   Do you have the ability to pay for your medications:Yes,    Interagency Information:     Patient to Follow up at:  Follow-up Information    Follow up with AA meetings. (See brochure for meeting times and locations)          Patient denies SI/HI:   Yes,      Safety Planning and Suicide Prevention discussed:  Yes,    Barrier to discharge identified:No.  Summary and Recommendations: Pt will go home his father will transport. Pt was cleared for D/C and denies any and all S/I and H/I.     Gevena Mart 01/11/2011, 10:00 AM

## 2011-01-11 NOTE — Progress Notes (Signed)
Patient ID: Gerald Rivera, male   DOB: January 22, 1961, 50 y.o.   MRN: [id number] D/C note NSG:  Pt anxious for immediate d/c.  All f/u reviewed and all medication education done. Sample medications with Rx's given.  All belongings returned from locker and pt escorted to lobby to wait for family. Denies SI. Kingman Cellar RN

## 2013-05-06 ENCOUNTER — Emergency Department (HOSPITAL_COMMUNITY): Payer: BC Managed Care – PPO

## 2013-05-06 ENCOUNTER — Encounter (HOSPITAL_COMMUNITY): Payer: Self-pay | Admitting: Emergency Medicine

## 2013-05-06 ENCOUNTER — Emergency Department (HOSPITAL_COMMUNITY)
Admission: EM | Admit: 2013-05-06 | Discharge: 2013-05-06 | Disposition: A | Discharge status: Home / Self Care | Payer: BC Managed Care – PPO | Attending: Emergency Medicine | Admitting: Emergency Medicine

## 2013-05-06 DIAGNOSIS — S20212A Contusion of left front wall of thorax, initial encounter: Secondary | ICD-10-CM

## 2013-05-06 DIAGNOSIS — R0602 Shortness of breath: Secondary | ICD-10-CM | POA: Insufficient documentation

## 2013-05-06 DIAGNOSIS — R296 Repeated falls: Secondary | ICD-10-CM | POA: Insufficient documentation

## 2013-05-06 DIAGNOSIS — F17200 Nicotine dependence, unspecified, uncomplicated: Secondary | ICD-10-CM | POA: Insufficient documentation

## 2013-05-06 DIAGNOSIS — Y939 Activity, unspecified: Secondary | ICD-10-CM | POA: Insufficient documentation

## 2013-05-06 DIAGNOSIS — Y92009 Unspecified place in unspecified non-institutional (private) residence as the place of occurrence of the external cause: Secondary | ICD-10-CM | POA: Insufficient documentation

## 2013-05-06 DIAGNOSIS — S20219A Contusion of unspecified front wall of thorax, initial encounter: Secondary | ICD-10-CM | POA: Insufficient documentation

## 2013-05-06 DIAGNOSIS — Z8659 Personal history of other mental and behavioral disorders: Secondary | ICD-10-CM | POA: Insufficient documentation

## 2013-05-06 MED ORDER — IBUPROFEN 200 MG PO TABS
600.0000 mg | ORAL_TABLET | Freq: Once | ORAL | Status: AC
  Administered 2013-05-06: 600 mg via ORAL
  Filled 2013-05-06: qty 3

## 2013-05-06 MED ORDER — OXYCODONE-ACETAMINOPHEN 5-325 MG PO TABS: 1.0000 | ORAL_TABLET | ORAL | Status: DC | PRN

## 2013-05-06 MED ORDER — HYDROMORPHONE HCL PF 1 MG/ML IJ SOLN
1.0000 mg | Freq: Once | INTRAMUSCULAR | Status: AC
  Administered 2013-05-06: 1 mg via INTRAMUSCULAR
  Filled 2013-05-06: qty 1

## 2013-05-06 NOTE — ED Notes (Addendum)
Pt c/o SOB, R side ribcage pain, cough, and nasal congestion x 1 week.  Pt reports pain increases w/ coughing.  Pain score 10/10.  Hx of heavy smoking.   Pt reports that he fell x 1 week ago.  Sts "I think, I might have a broken rib."

## 2013-05-06 NOTE — ED Notes (Signed)
Pt reports left ribcage pain with cough.

## 2013-05-06 NOTE — Discharge Instructions (Signed)
Rib Contusion A rib contusion (bruise) can occur by a blow to the chest or by a fall against a hard object. Usually these will be much better in a couple weeks. If X-rays were taken today and there are no broken bones (fractures), the diagnosis of bruising is made. However, broken ribs may not show up for several days, or may be discovered later on a routine X-ray when signs of healing show up. If this happens to you, it does not mean that something was missed on the X-ray, but simply that it did not show up on the first X-rays. Earlier diagnosis will not usually change the treatment. HOME CARE INSTRUCTIONS   Avoid strenuous activity. Be careful during activities and avoid bumping the injured ribs. Activities that pull on the injured ribs and cause pain should be avoided, if possible.  For the first day or two, an ice pack used every 20 minutes while awake may be helpful. Put ice in a plastic bag and put a towel between the bag and the skin.  Eat a normal, well-balanced diet. Drink plenty of fluids to avoid constipation.  Take deep breaths several times a day to keep lungs free of infection. Try to cough several times a day. Splint the injured area with a pillow while coughing to ease pain. Coughing can help prevent pneumonia.  Wear a rib belt or binder only if told to do so by your caregiver. If you are wearing a rib belt or binder, you must do the breathing exercises as directed by your caregiver. If not used properly, rib belts or binders restrict breathing which can lead to pneumonia.  Only take over-the-counter or prescription medicines for pain, discomfort, or fever as directed by your caregiver. SEEK MEDICAL CARE IF:   You or your child has an oral temperature above 102 F (38.9 C).  Your baby is older than 3 months with a rectal temperature of 100.5 F (38.1 C) or higher for more than 1 day.  You develop a cough, with thick or bloody sputum. SEEK IMMEDIATE MEDICAL CARE IF:   You  have difficulty breathing.  You feel sick to your stomach (nausea), have vomiting or belly (abdominal) pain.  You have worsening pain, not controlled with medications, or there is a change in the location of the pain.  You develop sweating or radiation of the pain into the arms, jaw or shoulders, or become light headed or faint.  You or your child has an oral temperature above 102 F (38.9 C), not controlled by medicine.  Your or your baby is older than 3 months with a rectal temperature of 102 F (38.9 C) or higher.  Your baby is 41 months old or younger with a rectal temperature of 100.4 F (38 C) or higher. MAKE SURE YOU:   Understand these instructions.  Will watch your condition.  Will get help right away if you are not doing well or get worse. Document Released: 10/15/2000 Document Revised: 05/17/2012 Document Reviewed: 09/08/2007 [facility] Patient Information 2014 [place], [place].   Emergency Department Resource Guide 1) Find a Doctor and Pay Out of Pocket Although you won't have to find out who is covered by your insurance plan, it is a good idea to ask around and get recommendations. You will then need to call the office and see if the doctor you have chosen will accept you as a new patient and what types of options they offer for patients who are self-pay. Some doctors offer discounts or  will set up payment plans for their patients who do not have insurance, but you will need to ask so you aren't surprised when you get to your appointment.  2) Contact Your Local Health Department Not all health departments have doctors that can see patients for sick visits, but many do, so it is worth a call to see if yours does. If you don't know where your local health department is, you can check in your phone book. The CDC also has a tool to help you locate your state's health department, and many state websites also have listings of all of their local health departments.  3) Find a  Walk-in Clinic If your illness is not likely to be very severe or complicated, you may want to try a walk in clinic. These are popping up all over the country in pharmacies, drugstores, and shopping centers. They're usually staffed by nurse practitioners or physician assistants that have been trained to treat common illnesses and complaints. They're usually fairly quick and inexpensive. However, if you have serious medical issues or chronic medical problems, these are probably not your best option.  No Primary Care Doctor: - Call Health Connect at  [phone] - they can help you locate a primary care doctor that  accepts your insurance, provides certain services, etc. - Physician Referral Service- [phone]  Chronic Pain Problems: Organization         Address  Phone   Notes  [facility]  [phone] Patients need to be referred by their primary care doctor.   Medication Assistance: Organization         Address  Phone   Notes  [facility] Medication [facility] [address] [place] [place], [place] [address] [phone] --Must be a resident of [facility] -- Must have NO insurance coverage whatsoever (no Medicaid/ Medicare, etc.) -- The pt. MUST have a primary care doctor that directs their care regularly and follows them in the community   [facility]  [phone]   [employer]  [phone]    Agencies that provide inexpensive medical care: Organization         Address  Phone   Notes  [facility]  [phone]   [facility]    [phone]   [facility] [address] [place], [place] [address] [phone]   [facility] [address] [place], [place] [phone]   Planned Parenthood    [phone]   [facility]    [phone]   [facility]  [address], [place] Phone:  [phone], Fax:  [phone] Hours of Operation:  9 am - 6 pm, M-F.  Also accepts Medicaid/Medicare and self-pay.  [facility]  [address], [place] Phone: [phone], Fax: [phone]. Hours of Operation:  8:30 am - 5:30 pm, M-F.  Also accepts Medicaid and self-pay.  [facility] [place] [address] [place] Phone: [phone]   [facility] [address] Natasha Bence [place], [place] [phone], Ext. [phone] Mondays & Thursdays: 7-9 AM.  First 15 patients are seen on a first come, first serve basis.    Medicaid-accepting [facility] Providers:  Organization         Address  Phone   Notes  [facility] [address], [place] [phone])  [phone] Also accepts self-pay patients.  [facility] [address] Laurell Josephs [place], [place]  [phone]   [facility] [address], [place] [phone]   [facility] [address], [place] [phone]   Renaye Rakers [address], [place]   [phone] Only accepts [place] patients after they have their name applied to their card.   Self-Pay (no insurance) in [facility]:  Organization         Address  Phone   Notes  Sickle Cell Patients, [facility] [address] [place], [place] [phone]   [facility] [address] [place], [place] [phone]   [facility] [place]  [address], [place] [phone]   [facility]/Dr. Osei-Bonsu  [address], [place] or [address], [place] [phone] Phone number for both [place] and [place] locations is the same.  [facility] [address], [place] [phone]   [facility] [address], [place] or [address] [phone]     [facility] [address], [place] [phone], phone; [phone], fax Sees patients 1st and 3rd Saturday of every month.  Must not qualify for public or private insurance (i.e. Medicaid, Medicare, [place] Health Choice, Veterans' Benefits)  Household income should be no more than 200% of the poverty level The clinic cannot treat you if you are pregnant or think you are pregnant  Sexually transmitted diseases are not treated at the clinic.    Dental Care: Organization         Address  Phone  Notes  [facility] [address] [place], [place] [phone] Accepts children up to age 6 who are enrolled in [place] or [place] Health Choice; pregnant women with a Medicaid card; and children who have applied for Medicaid or [place] Health Choice, but were declined, whose parents can pay a reduced fee at time of service.  [facility]  [address], [place] [phone] Accepts children up to age 71 who are enrolled in [place] or [place] Health Choice; pregnant women with a Medicaid card; and children who have applied for Medicaid or  Health Choice, but were declined, whose parents can pay a reduced fee at time of service.  [facility]  [address] [place], [place] [phone] Patients are seen by appointment only. Walk-ins are not accepted. [facility] will see patients 30 years of age and older. Monday - Tuesday (8am-5pm) Most Wednesdays (8:30-5pm) $30 per visit, cash only  [facility] Adult Dental Access PRO[facility]  [address], [facility] [phone] Patients are seen by appointment only. Walk-ins are not accepted. [facility] will see patients 87 years of age and older. One Wednesday Evening (Monthly: Volunteer Based).  $30 per visit, cash only  [employer]  [phone] for adults; Children under age 65, call  Graduate Pediatric Dentistry at [phone]. Children aged 21-14, please call [phone] to request a pediatric application.  Dental services are provided in all areas of dental care including fillings, crowns and bridges, complete and partial dentures, implants, gum treatment, root canals, and extractions. Preventive care is also  provided. Treatment is provided to both adults and children. Patients are selected via a lottery and there is often a waiting list.   [facility] [address] [place]  [phone] [email or web address]   [facility] [address] [place] [address] [place], [place] [phone], Ext. [phone] Second and Fourth Thursday of each month, opens at 6:30 AM; Clinic ends at 9 AM.  Patients are seen on a first-come first-served basis, and a limited number are seen during each clinic.   [facility]  [address] Ether Griffins [place], [place] [phone]   Eligibility Requirements You must have lived in [place], [place], or [place] counties for at least the last three months.   You cannot be eligible for state or federal sponsored [place], including [employer], [place], or [employer].   You generally cannot be eligible for healthcare insurance through your employer.    How to apply: Eligibility screenings are held every Tuesday and Wednesday afternoon from 1:00 pm until 4:00 pm. You do not need an appointment for the interview!  [facility] [address], [place], [place] [phone]   [facility] Health Department  [phone]   [facility] Health Department  [phone]   [facility]  [phone]    Behavioral Health Resources in the Community: Intensive Outpatient Programs Organization         Address  Phone  Notes  [facility] [address], [place], [place] [phone]   [facility] [address] [place], [place]  [phone]   [facility] [address] [place], [place]  [phone]   [facility] [address],  [place], [place] [phone] or [phone]   Substance Abuse Resources Organization         Address  Phone  Notes  [facility]  [phone]   [facility]  [phone]   [facility]  [phone]   Floydene Flock  [phone]   Residential & Outpatient Substance Abuse Program  [phone]   Psychological Services Organization         Address  Phone  Notes  [facility]  [phone]   [facility] Services  [phone]   [facility] Mental Health [address], [place] [phone] or [phone]    Mobile Crisis Teams Organization         Address  Phone  Notes  [facility], Mobile Crisis Care Unit  [phone]   [facility]  [address] [place], [place] [phone]   Doristine Locks [address] [place] [phone]    Self-Help/Support Groups Organization         Address  Phone             Notes  [facility] [place] - variety of support groups  [phone]- I7437963 Call for more information  Narcotics Anonymous (NA), [facility] [address], [employer] [place]  2 meetings at this location   Statistician         Address  Phone  Notes  [facility] [address],    [place]  [phone]   [facility]  [address], [place] [address], [place], [place] [phone]   [facility] [address] [place], [place] [phone] Admissions: 8am-3pm M-F  [facility] [address]    [place], [place] [phone]   [facility] [address], [place], [place] [phone]   [facility] [address]  [place], [place] [phone]   [facility] - Intensive Outpatient [address]ure[address], [place], [place] [phone]   [facility] ([facility] [address]  [place], [place] [phone] or [phone]   [facility] ([facility]) [address] [place], [place] [phone] Accepts Medicaid  [facility] [address]  [place] [phone] Substance Abuse/Addiction Treatment   [facility] Behavioral Health Resources Organization         Address  Phone  Notes  [facility]  [phone]) [phone]   Angie FavaJulie Brannon, PhD [address] [place], [place]   [phone]) [phone] or [phone]   [facility]   [address] [place], [place] [phone]) [phone]   [facility] [address], [place], [place] [phone]) [phone] Insurance/Medicaid/sponsorship through [facility]  [facility] [address]                                    [place], [place] [phone]) [phone] Therapy/tele-psych/case  [facility] [address]   [place], [place] [phone]) [phone]    Dr. Lolly MustacheArfeen  [phone](336) [phone]   [facility]  [facility] 1) [address], [place] 2) [address], [place] 3)  [address], [place] [phone]  [phone](336) [phone]   [facility] Child Abuse Hotline [phone]) [phone] or [phone]) [phone] (After Hours)

## 2013-05-06 NOTE — ED Notes (Signed)
Attempted to d/c pt who stated he did not feel like he needed to leave. Reassured pt of neg xray, stable VS, normal exam. Pt repeated that he did not feel like he should leave. [place], EDP in to see pt. Reinforced that pt's exam is consistent with a chest wall contusion and that he feels pt is safe to go home. Pt verbalized understanding. D/c instructions and home care discussed with pt and pt d/c, stable, in NAD, ambulatory to d/c window.
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
## 2013-08-30 ENCOUNTER — Emergency Department (HOSPITAL_COMMUNITY): Payer: Self-pay

## 2013-08-30 ENCOUNTER — Emergency Department (HOSPITAL_COMMUNITY)
Admission: EM | Admit: 2013-08-30 | Discharge: 2013-08-30 | Disposition: A | Discharge status: Home / Self Care | Payer: BC Managed Care – PPO | Attending: Emergency Medicine | Admitting: Emergency Medicine

## 2013-08-30 ENCOUNTER — Encounter (HOSPITAL_COMMUNITY): Payer: Self-pay | Admitting: Emergency Medicine

## 2013-08-30 DIAGNOSIS — S4980XA Other specified injuries of shoulder and upper arm, unspecified arm, initial encounter: Secondary | ICD-10-CM | POA: Insufficient documentation

## 2013-08-30 DIAGNOSIS — S022XXA Fracture of nasal bones, initial encounter for closed fracture: Secondary | ICD-10-CM | POA: Insufficient documentation

## 2013-08-30 DIAGNOSIS — F17200 Nicotine dependence, unspecified, uncomplicated: Secondary | ICD-10-CM | POA: Insufficient documentation

## 2013-08-30 DIAGNOSIS — F10229 Alcohol dependence with intoxication, unspecified: Secondary | ICD-10-CM | POA: Insufficient documentation

## 2013-08-30 DIAGNOSIS — R404 Transient alteration of awareness: Secondary | ICD-10-CM | POA: Insufficient documentation

## 2013-08-30 DIAGNOSIS — S0510XA Contusion of eyeball and orbital tissues, unspecified eye, initial encounter: Secondary | ICD-10-CM | POA: Insufficient documentation

## 2013-08-30 DIAGNOSIS — S46909A Unspecified injury of unspecified muscle, fascia and tendon at shoulder and upper arm level, unspecified arm, initial encounter: Secondary | ICD-10-CM | POA: Insufficient documentation

## 2013-08-30 DIAGNOSIS — S298XXA Other specified injuries of thorax, initial encounter: Secondary | ICD-10-CM | POA: Insufficient documentation

## 2013-08-30 DIAGNOSIS — F10920 Alcohol use, unspecified with intoxication, uncomplicated: Secondary | ICD-10-CM

## 2013-08-30 LAB — ETHANOL
Alcohol, Ethyl (B): 329 mg/dL — ABNORMAL HIGH (ref 0–11)
Alcohol, Ethyl (B): 199 mg/dL — ABNORMAL HIGH (ref 0–11)

## 2013-08-30 MED ORDER — FENTANYL CITRATE 0.05 MG/ML IJ SOLN
100.0000 ug | Freq: Once | INTRAMUSCULAR | Status: AC
  Administered 2013-08-30: 100 ug via INTRAVENOUS
  Filled 2013-08-30: qty 2

## 2013-08-30 MED ORDER — OXYCODONE-ACETAMINOPHEN 5-325 MG PO TABS: 1.0000 | ORAL_TABLET | ORAL | Status: DC | PRN

## 2013-08-30 NOTE — ED Provider Notes (Signed)
Medical screening examination/treatment/procedure(s) were performed by non-physician practitioner and as supervising physician I was immediately available for consultation/collaboration.   EKG Interpretation None       Flint MelterElliott L [place], MD 08/30/13 1944

## 2013-08-30 NOTE — ED Notes (Signed)
CSI and GPD at bedside  
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
## 2013-08-30 NOTE — ED Notes (Signed)
Pt states right shoulder is hurting him.
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
## 2013-08-30 NOTE — ED Notes (Signed)
Patient was educated not to drive, operate heavy machinery, or drink alcohol while taking narcotic medication.  
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
## 2013-09-06 NOTE — ED Provider Notes (Signed)
Medical screening examination/treatment/procedure(s) were performed by non-physician practitioner and as supervising physician I was immediately available for consultation/collaboration.   EKG Interpretation   Date/Time:  Tuesday August 30 2013 10:57:12 EDT Ventricular Rate:  69 PR Interval:  172 QRS Duration: 90 QT Interval:  419 QTC Calculation: 449 R Axis:   75 Text Interpretation:  Sinus rhythm ST elev, probable normal early repol  pattern Baseline wander in lead(s) I V1 V2 ED PHYSICIAN INTERPRETATION  AVAILABLE IN [facility] Confirmed by TEST, Record ([id number]) on  09/01/2013 7:50:07 AM       Derwood KaplanAnkit Que Meneely, MD 09/06/13 1620
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
## 2016-11-16 ENCOUNTER — Emergency Department (HOSPITAL_COMMUNITY): Payer: Self-pay

## 2016-11-16 ENCOUNTER — Encounter (HOSPITAL_COMMUNITY): Payer: Self-pay | Admitting: Emergency Medicine

## 2016-11-16 ENCOUNTER — Emergency Department (HOSPITAL_COMMUNITY)
Admission: EM | Admit: 2016-11-16 | Discharge: 2016-11-16 | Disposition: A | Discharge status: Home / Self Care | Payer: Self-pay | Attending: Emergency Medicine | Admitting: Emergency Medicine

## 2016-11-16 DIAGNOSIS — F17210 Nicotine dependence, cigarettes, uncomplicated: Secondary | ICD-10-CM | POA: Insufficient documentation

## 2016-11-16 DIAGNOSIS — R0789 Other chest pain: Secondary | ICD-10-CM | POA: Insufficient documentation

## 2016-11-16 DIAGNOSIS — F1010 Alcohol abuse, uncomplicated: Secondary | ICD-10-CM | POA: Insufficient documentation

## 2016-11-16 LAB — I-STAT CHEM 8, ED
BUN: 10 mg/dL (ref 6–20)
CHLORIDE: 103 mmol/L (ref 101–111)
CREATININE: 0.9 mg/dL (ref 0.61–1.24)
Calcium, Ion: 1.05 mmol/L — ABNORMAL LOW (ref 1.15–1.40)
Glucose, Bld: 90 mg/dL (ref 65–99)
HCT: 48 % (ref 39.0–52.0)
Hemoglobin: 16.3 g/dL (ref 13.0–17.0)
POTASSIUM: 4.4 mmol/L (ref 3.5–5.1)
Sodium: 138 mmol/L (ref 135–145)
TCO2: 23 mmol/L (ref 22–32)

## 2016-11-16 LAB — I-STAT TROPONIN, ED: Troponin i, poc: 0 ng/mL (ref 0.00–0.08)

## 2016-11-16 MED ORDER — ACETAMINOPHEN 325 MG PO TABS
650.0000 mg | ORAL_TABLET | Freq: Once | ORAL | Status: AC
  Administered 2016-11-16: 650 mg via ORAL
  Filled 2016-11-16: qty 2
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
## 2016-11-16 NOTE — Discharge Instructions (Signed)
Take Tylenol or Advil as directed for pain. Call any of the numbers on the resource guide to get help with your alcohol problem.call the number on these discharge instructions get a primary care physician. Ask your new primary care physician to help you to stop smoking

## 2016-11-21 ENCOUNTER — Encounter (HOSPITAL_BASED_OUTPATIENT_CLINIC_OR_DEPARTMENT_OTHER): Payer: Self-pay | Admitting: *Deleted

## 2016-11-21 ENCOUNTER — Emergency Department (HOSPITAL_BASED_OUTPATIENT_CLINIC_OR_DEPARTMENT_OTHER)
Admission: EM | Admit: 2016-11-21 | Discharge: 2016-11-21 | Disposition: A | Discharge status: Home / Self Care | Payer: Self-pay | Attending: Emergency Medicine | Admitting: Emergency Medicine

## 2016-11-21 DIAGNOSIS — F10220 Alcohol dependence with intoxication, uncomplicated: Secondary | ICD-10-CM | POA: Insufficient documentation

## 2016-11-21 DIAGNOSIS — F10920 Alcohol use, unspecified with intoxication, uncomplicated: Secondary | ICD-10-CM

## 2016-11-21 DIAGNOSIS — F17210 Nicotine dependence, cigarettes, uncomplicated: Secondary | ICD-10-CM | POA: Insufficient documentation

## 2016-11-21 HISTORY — DX: Other psychoactive substance dependence, uncomplicated: F19.20

## 2016-11-21 MED ORDER — IBUPROFEN 800 MG PO TABS
800.0000 mg | ORAL_TABLET | Freq: Once | ORAL | Status: AC
  Administered 2016-11-21: 800 mg via ORAL
  Filled 2016-11-21: qty 1
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
## 2018-05-06 ENCOUNTER — Emergency Department (HOSPITAL_COMMUNITY): Payer: Self-pay

## 2018-05-06 ENCOUNTER — Encounter (HOSPITAL_COMMUNITY): Payer: Self-pay

## 2018-05-06 ENCOUNTER — Emergency Department (HOSPITAL_COMMUNITY)
Admission: EM | Admit: 2018-05-06 | Discharge: 2018-05-06 | Disposition: A | Discharge status: Home / Self Care | Payer: Self-pay | Attending: Emergency Medicine | Admitting: Emergency Medicine

## 2018-05-06 DIAGNOSIS — F17210 Nicotine dependence, cigarettes, uncomplicated: Secondary | ICD-10-CM | POA: Insufficient documentation

## 2018-05-06 DIAGNOSIS — Y929 Unspecified place or not applicable: Secondary | ICD-10-CM | POA: Insufficient documentation

## 2018-05-06 DIAGNOSIS — S61412A Laceration without foreign body of left hand, initial encounter: Secondary | ICD-10-CM | POA: Insufficient documentation

## 2018-05-06 DIAGNOSIS — Y999 Unspecified external cause status: Secondary | ICD-10-CM | POA: Insufficient documentation

## 2018-05-06 DIAGNOSIS — Y939 Activity, unspecified: Secondary | ICD-10-CM | POA: Insufficient documentation

## 2018-05-06 DIAGNOSIS — S50812A Abrasion of left forearm, initial encounter: Secondary | ICD-10-CM | POA: Insufficient documentation
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
## 2018-05-06 NOTE — Discharge Instructions (Addendum)
Take ibuprofen or acetaminophen as needed for pain. °

## 2018-07-30 ENCOUNTER — Emergency Department (HOSPITAL_COMMUNITY): Payer: Self-pay

## 2018-07-30 ENCOUNTER — Emergency Department (HOSPITAL_COMMUNITY)
Admission: EM | Admit: 2018-07-30 | Discharge: 2018-07-31 | Disposition: A | Discharge status: Home / Self Care | Payer: Self-pay | Attending: Emergency Medicine | Admitting: Emergency Medicine

## 2018-07-30 ENCOUNTER — Other Ambulatory Visit: Payer: Self-pay

## 2018-07-30 ENCOUNTER — Encounter (HOSPITAL_COMMUNITY): Payer: Self-pay | Admitting: Emergency Medicine

## 2018-07-30 DIAGNOSIS — Y939 Activity, unspecified: Secondary | ICD-10-CM | POA: Insufficient documentation

## 2018-07-30 DIAGNOSIS — S0083XA Contusion of other part of head, initial encounter: Secondary | ICD-10-CM | POA: Insufficient documentation

## 2018-07-30 DIAGNOSIS — S0990XA Unspecified injury of head, initial encounter: Secondary | ICD-10-CM

## 2018-07-30 DIAGNOSIS — S0502XA Injury of conjunctiva and corneal abrasion without foreign body, left eye, initial encounter: Secondary | ICD-10-CM | POA: Insufficient documentation

## 2018-07-30 DIAGNOSIS — Y999 Unspecified external cause status: Secondary | ICD-10-CM | POA: Insufficient documentation

## 2018-07-30 DIAGNOSIS — Y908 Blood alcohol level of 240 mg/100 ml or more: Secondary | ICD-10-CM | POA: Insufficient documentation

## 2018-07-30 DIAGNOSIS — Z23 Encounter for immunization: Secondary | ICD-10-CM | POA: Insufficient documentation

## 2018-07-30 DIAGNOSIS — F17210 Nicotine dependence, cigarettes, uncomplicated: Secondary | ICD-10-CM | POA: Insufficient documentation

## 2018-07-30 DIAGNOSIS — R51 Headache: Secondary | ICD-10-CM | POA: Insufficient documentation

## 2018-07-30 DIAGNOSIS — F10920 Alcohol use, unspecified with intoxication, uncomplicated: Secondary | ICD-10-CM | POA: Insufficient documentation

## 2018-07-30 DIAGNOSIS — Y929 Unspecified place or not applicable: Secondary | ICD-10-CM | POA: Insufficient documentation

## 2018-07-30 LAB — CBC
HCT: 44.6 % (ref 39.0–52.0)
Hemoglobin: 15 g/dL (ref 13.0–17.0)
MCH: 32.4 pg (ref 26.0–34.0)
MCHC: 33.6 g/dL (ref 30.0–36.0)
MCV: 96.3 fL (ref 80.0–100.0)
Platelets: 213 10*3/uL (ref 150–400)
RBC: 4.63 MIL/uL (ref 4.22–5.81)
RDW: 14.4 % (ref 11.5–15.5)
WBC: 8.4 10*3/uL (ref 4.0–10.5)
nRBC: 0 % (ref 0.0–0.2)

## 2018-07-30 LAB — BASIC METABOLIC PANEL
Anion gap: 11 (ref 5–15)
BUN: 8 mg/dL (ref 6–20)
CO2: 21 mmol/L — ABNORMAL LOW (ref 22–32)
Calcium: 9.1 mg/dL (ref 8.9–10.3)
Chloride: 104 mmol/L (ref 98–111)
Creatinine, Ser: 0.9 mg/dL (ref 0.61–1.24)
GFR calc Af Amer: >60 mL/min (ref >60)
GFR calc non Af Amer: >60 mL/min (ref >60)
Glucose, Bld: 101 mg/dL — ABNORMAL HIGH (ref 70–99)
Potassium: 4 mmol/L (ref 3.5–5.1)
Sodium: 136 mmol/L (ref 135–145)

## 2018-07-30 LAB — ETHANOL: Alcohol, Ethyl (B): 324 mg/dL (ref <10)

## 2018-07-30 MED ORDER — TETRACAINE HCL 0.5 % OP SOLN
2.0000 [drp] | Freq: Once | OPHTHALMIC | Status: AC
  Administered 2018-07-30: 2 [drp] via OPHTHALMIC
  Filled 2018-07-30: qty 4

## 2018-07-30 MED ORDER — TETANUS-DIPHTH-ACELL PERTUSSIS 5-2.5-18.5 LF-MCG/0.5 IM SUSP
0.5000 mL | Freq: Once | INTRAMUSCULAR | Status: AC
  Administered 2018-07-30: 0.5 mL via INTRAMUSCULAR
  Filled 2018-07-30: qty 0.5

## 2018-07-30 MED ORDER — FLUORESCEIN SODIUM 1 MG OP STRP
1.0000 | ORAL_STRIP | Freq: Once | OPHTHALMIC | Status: AC
  Administered 2018-07-30: 1 via OPHTHALMIC
  Filled 2018-07-30: qty 1
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
## 2018-07-30 NOTE — ED Notes (Signed)
Patient transported to CT and xray 

## 2018-07-31 ENCOUNTER — Other Ambulatory Visit: Payer: Self-pay

## 2018-07-31 ENCOUNTER — Emergency Department (HOSPITAL_COMMUNITY)
Admission: EM | Admit: 2018-07-31 | Discharge: 2018-07-31 | Disposition: A | Discharge status: Home / Self Care | Payer: Self-pay | Attending: Emergency Medicine | Admitting: Emergency Medicine

## 2018-07-31 ENCOUNTER — Encounter (HOSPITAL_COMMUNITY): Payer: Self-pay | Admitting: Emergency Medicine

## 2018-07-31 DIAGNOSIS — F17210 Nicotine dependence, cigarettes, uncomplicated: Secondary | ICD-10-CM | POA: Insufficient documentation

## 2018-07-31 DIAGNOSIS — R220 Localized swelling, mass and lump, head: Secondary | ICD-10-CM

## 2018-07-31 DIAGNOSIS — S0993XD Unspecified injury of face, subsequent encounter: Secondary | ICD-10-CM | POA: Insufficient documentation

## 2018-07-31 LAB — TROPONIN I (HIGH SENSITIVITY)
Troponin I (High Sensitivity): 39 ng/L — ABNORMAL HIGH (ref <18)
Troponin I (High Sensitivity): 43 ng/L — ABNORMAL HIGH (ref <18)

## 2018-07-31 MED ORDER — HYDROCODONE-ACETAMINOPHEN 5-325 MG PO TABS
1.0000 | ORAL_TABLET | Freq: Once | ORAL | Status: AC
  Administered 2018-07-31: 1 via ORAL
  Filled 2018-07-31: qty 1

## 2018-07-31 MED ORDER — POLYMYXIN B-TRIMETHOPRIM 10000-0.1 UNIT/ML-% OP SOLN: 1.0000 [drp] | OPHTHALMIC | 0 refills | Status: DC

## 2018-07-31 MED ORDER — IBUPROFEN 600 MG PO TABS: 600.0000 mg | ORAL_TABLET | Freq: Four times a day (QID) | ORAL | 0 refills | Status: AC | PRN

## 2018-07-31 MED ORDER — ACETAMINOPHEN 500 MG PO TABS
1000.0000 mg | ORAL_TABLET | Freq: Once | ORAL | Status: AC
  Administered 2018-07-31: 04:00:00 1000 mg via ORAL

## 2018-07-31 MED ORDER — NAPROXEN 500 MG PO TABS
500.0000 mg | ORAL_TABLET | Freq: Once | ORAL | Status: AC
  Administered 2018-07-31: 500 mg via ORAL
  Filled 2018-07-31: qty 1

## 2018-07-31 MED ORDER — POLYMYXIN B-TRIMETHOPRIM 10000-0.1 UNIT/ML-% OP SOLN: 1.0000 [drp] | OPHTHALMIC | 0 refills | Status: AC
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
## 2018-07-31 NOTE — Discharge Instructions (Addendum)
°  We saw in the ER for your facial pain.  CT scan from yesterday were reviewed and he did not have any fractures. Take the medications prescribed for pain control.
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
## 2018-07-31 NOTE — Discharge Instructions (Signed)
Scan is negative for any fracture or injury inside your brain.  Reduce your alcohol intake stop using cocaine.  Use the eyedrops as prescribed for a scratch in your cornea.  Follow-up with your primary doctor.  Return to the ED with new or worsening symptoms.
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
## 2019-10-05 DEATH — deceased
